# Patient Record
Sex: Female | Born: 1990 | Race: Black or African American | Hispanic: No | Marital: Single | Smoking: Never smoker
Health system: Southern US, Community
[De-identification: ages and names within clinical notes are randomized; demographics above are authoritative.]

## PROBLEM LIST (undated history)

## (undated) DIAGNOSIS — L732 Hidradenitis suppurativa: Secondary | ICD-10-CM

## (undated) DIAGNOSIS — I10 Essential (primary) hypertension: Secondary | ICD-10-CM

## (undated) HISTORY — PX: OTHER SURGICAL HISTORY: SHX169

---
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
## 2012-10-24 DIAGNOSIS — F32A Depression, unspecified: Secondary | ICD-10-CM | POA: Insufficient documentation

## 2013-02-28 IMAGING — CR DG HAND COMPLETE 3+V*L*
3 series · 3 of 3 positions shown · non-contrast
Comparison: None.

CLINICAL DATA: Pain.  No known injury.

LEFT HAND - COMPLETE 3+ VIEW

[view not recorded (1 of 3)]
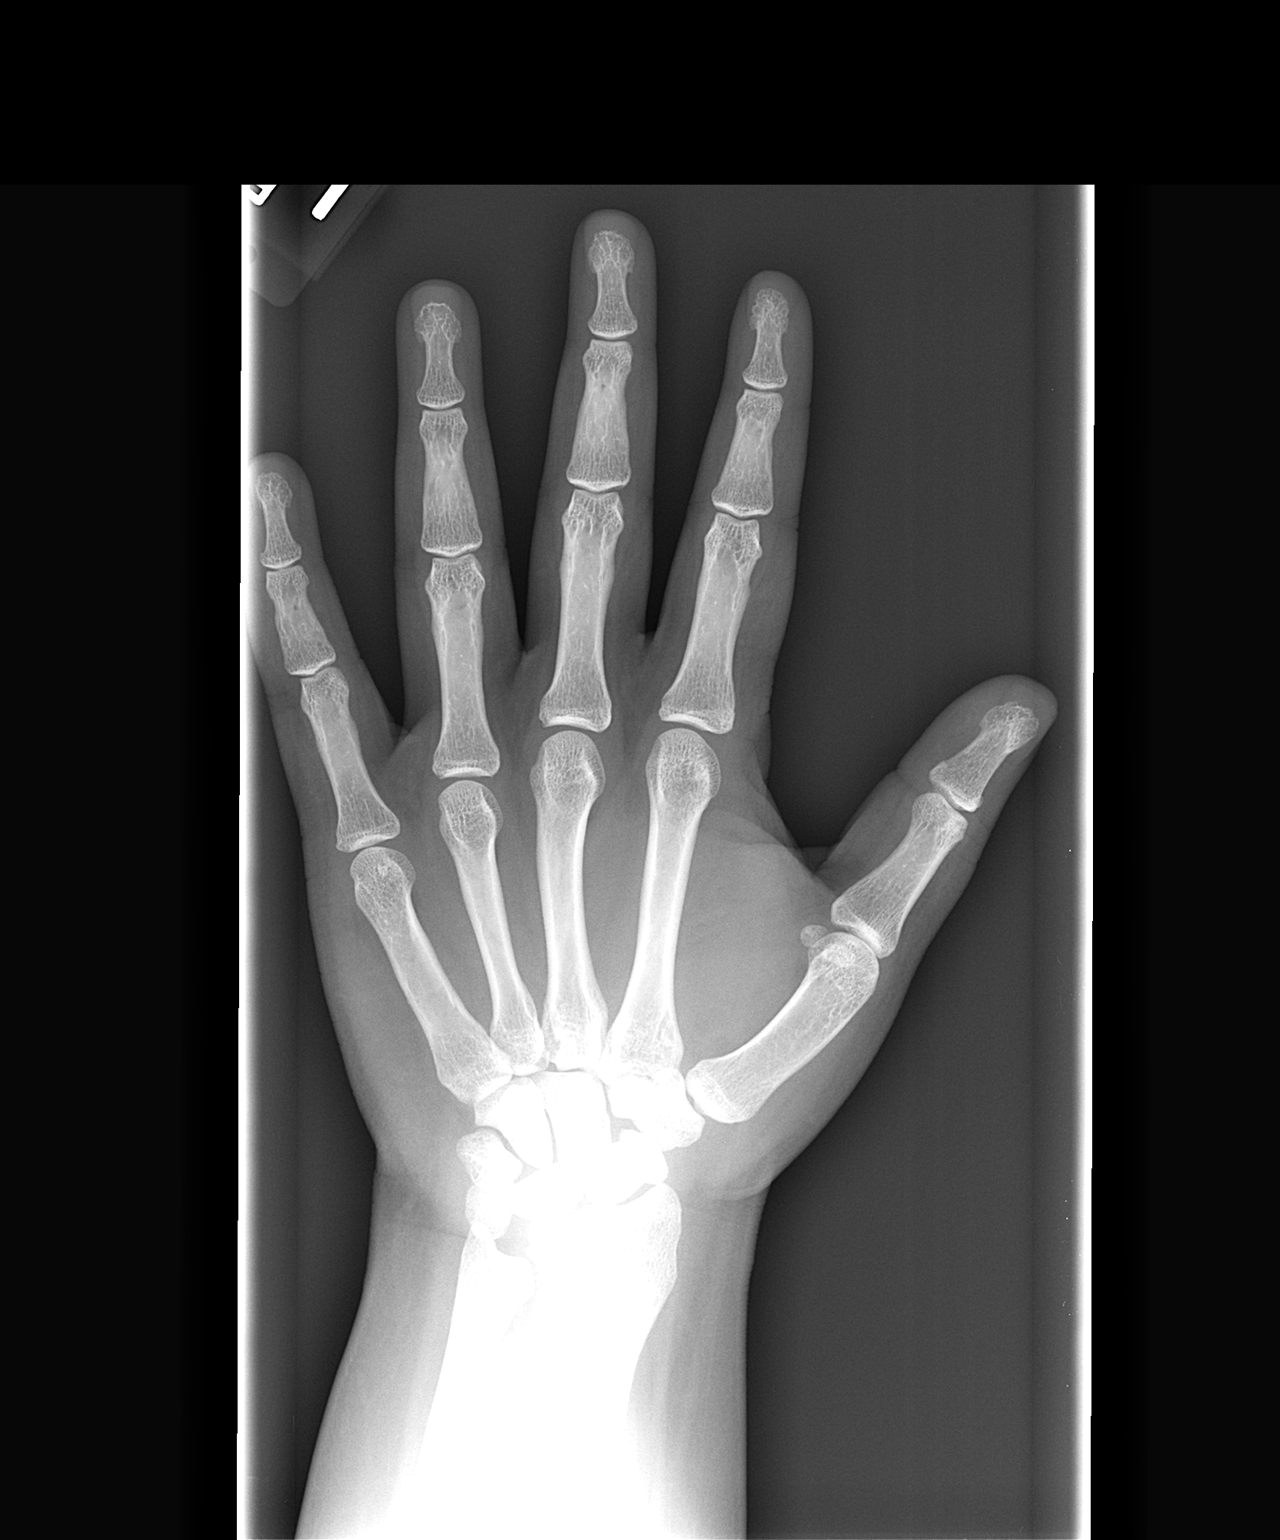

[view not recorded (2 of 3)]
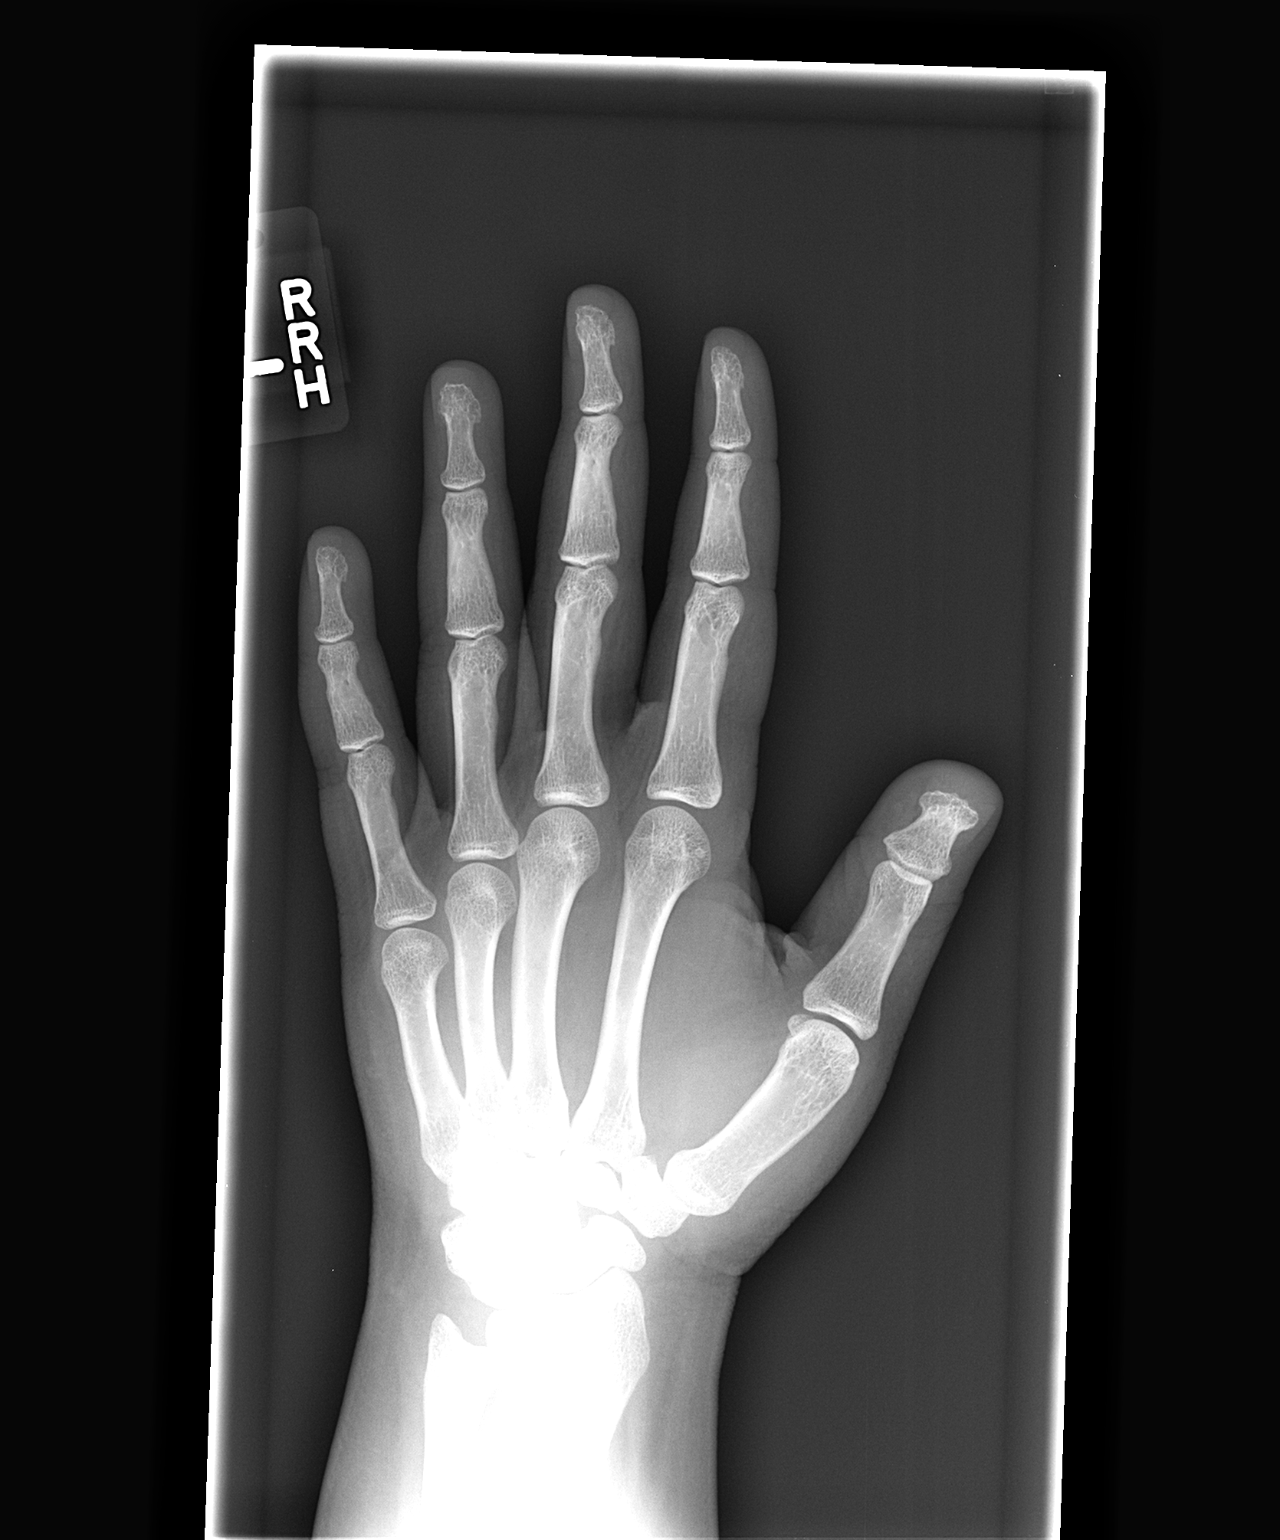

[view not recorded (3 of 3)]
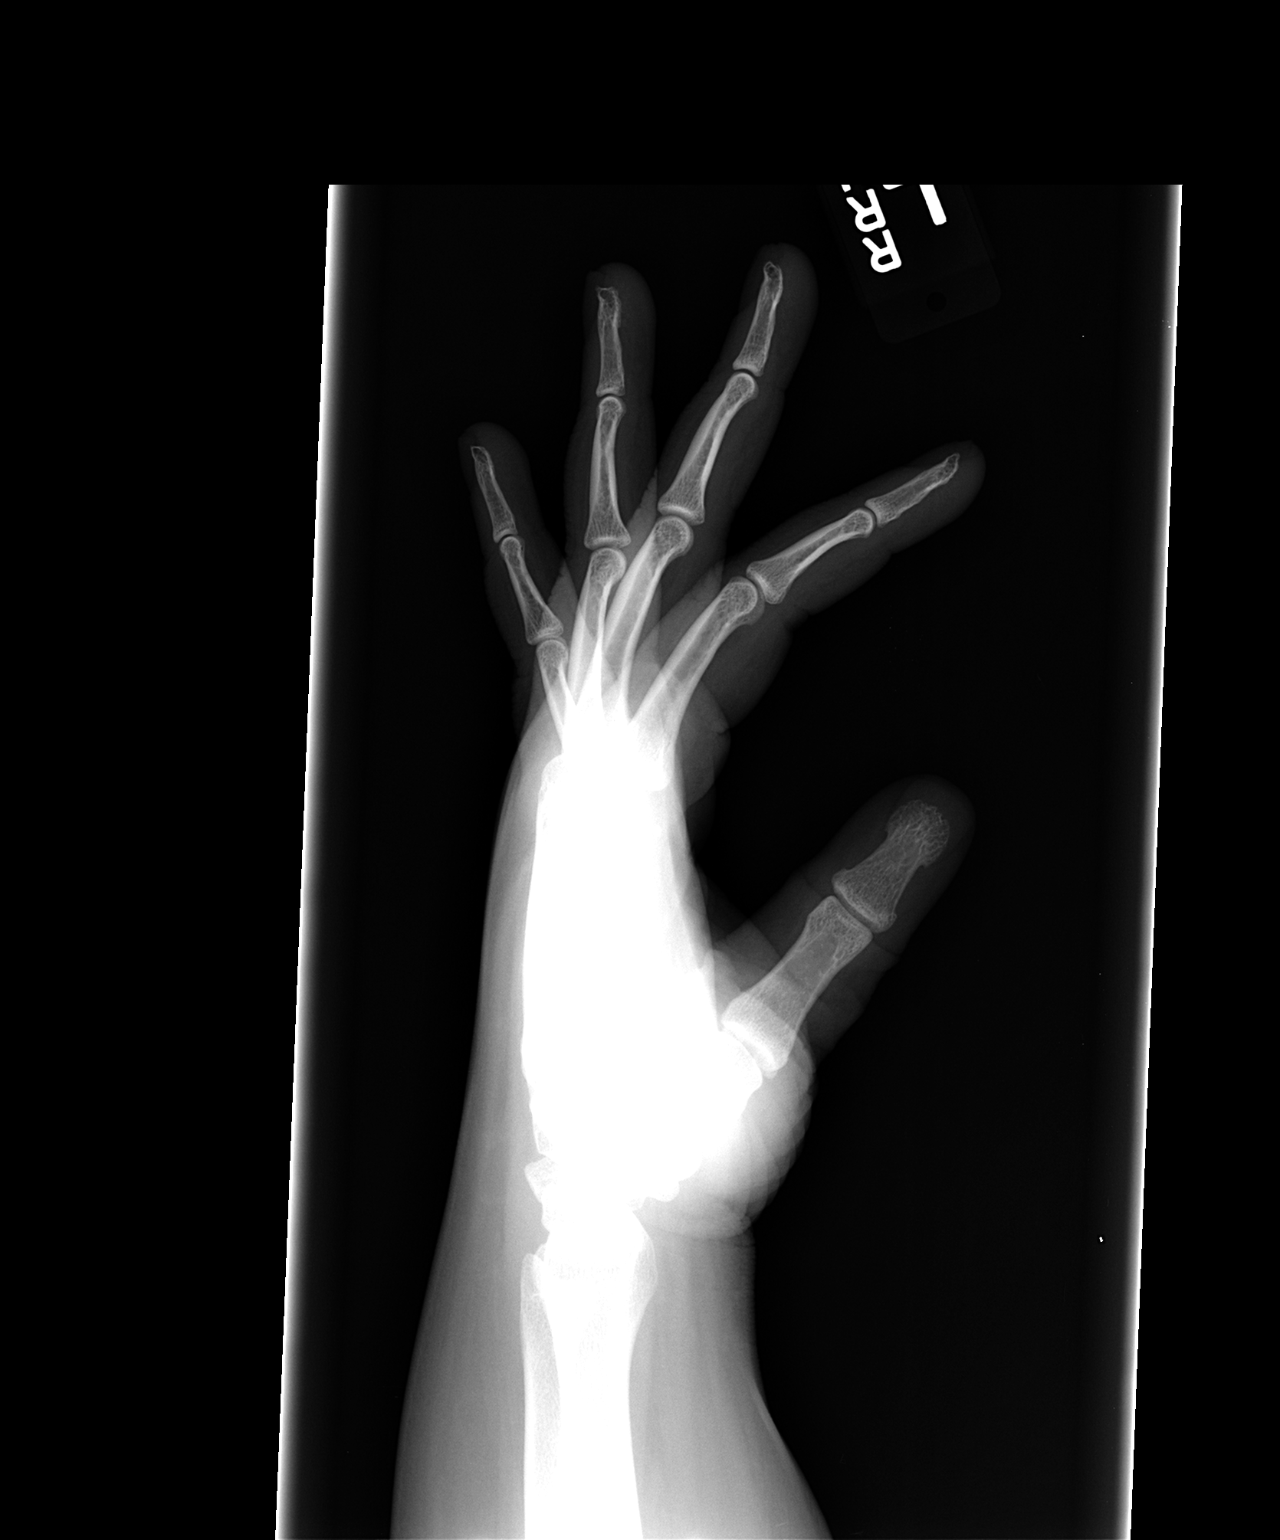

[3 of 3 positions shown; findings below may reference images not displayed]

FINDINGS: There is no evidence of fracture or dislocation.  There
is no evidence of arthropathy or other focal bone abnormality.
Soft tissues are unremarkable.
IMPRESSION: Negative.

## 2013-09-30 DIAGNOSIS — E785 Hyperlipidemia, unspecified: Secondary | ICD-10-CM | POA: Insufficient documentation

## 2021-08-31 ENCOUNTER — Encounter (HOSPITAL_BASED_OUTPATIENT_CLINIC_OR_DEPARTMENT_OTHER): Payer: Self-pay

## 2021-08-31 ENCOUNTER — Emergency Department (HOSPITAL_BASED_OUTPATIENT_CLINIC_OR_DEPARTMENT_OTHER)
Admission: EM | Admit: 2021-08-31 | Discharge: 2021-09-01 | Disposition: A | Discharge status: Home / Self Care | Payer: Self-pay | Attending: Emergency Medicine | Admitting: Emergency Medicine

## 2021-08-31 ENCOUNTER — Other Ambulatory Visit: Payer: Self-pay

## 2021-08-31 DIAGNOSIS — I10 Essential (primary) hypertension: Secondary | ICD-10-CM | POA: Insufficient documentation

## 2021-08-31 DIAGNOSIS — L02214 Cutaneous abscess of groin: Secondary | ICD-10-CM | POA: Insufficient documentation

## 2021-08-31 HISTORY — DX: Essential (primary) hypertension: I10

## 2021-08-31 HISTORY — DX: Hidradenitis suppurativa: L73.2

## 2021-08-31 NOTE — ED Triage Notes (Signed)
Pt c/o left hip pain x 2 days-denies injury-NAD-steady gait

## 2021-09-01 LAB — PREGNANCY, URINE: Preg Test, Ur: NEGATIVE

## 2021-09-01 MED ORDER — DOXYCYCLINE HYCLATE 100 MG PO CAPS: 100.0000 mg | ORAL_CAPSULE | Freq: Two times a day (BID) | ORAL | 0 refills | Status: DC

## 2021-09-01 MED ORDER — LIDOCAINE HCL (PF) 1 % IJ SOLN
5.0000 mL | Freq: Once | INTRAMUSCULAR | Status: AC
  Administered 2021-09-01: 5 mL via INTRADERMAL
  Filled 2021-09-01: qty 5

## 2021-09-01 MED ORDER — HYDROCODONE-ACETAMINOPHEN 5-325 MG PO TABS: 1.0000 | ORAL_TABLET | Freq: Four times a day (QID) | ORAL | 0 refills | Status: DC | PRN

## 2021-09-01 MED ORDER — DOXYCYCLINE HYCLATE 100 MG PO TABS
100.0000 mg | ORAL_TABLET | Freq: Once | ORAL | Status: AC
  Administered 2021-09-01: 100 mg via ORAL
  Filled 2021-09-01: qty 1

## 2021-09-01 NOTE — Discharge Instructions (Addendum)
Begin taking doxycycline as prescribed.  Hydrocodone as prescribed as needed for pain.  Apply warm compresses or perform sits baths as frequently as possible for the next several days.  Return to the emergency department for increased pain, increased swelling, high fever, or other new and concerning symptoms.

## 2021-09-01 NOTE — ED Provider Notes (Signed)
[facility] EMERGENCY DEPARTMENT Provider Note   CSN: [id number] Arrival date & time: 08/31/21  2028     History Chief Complaint  Patient presents with   Hip Pain    Brenda Calhoun is a 30 y.o. female.  Patient is a 29 year old female with history of hypertension and hidradenitis suppurativa.  Patient presenting with left groin pain and swelling.  This has been worsening over the past several days.  She has had multiple abscesses in the past and this feels the same.  She denies any fevers or chills.  Symptoms unrelieved with warm compresses.  The history is provided by the patient.      Past Medical History:  Diagnosis Date   Hydradenitis    Hypertension     There are no problems to display for this patient.   History reviewed. No pertinent surgical history.   OB History   No obstetric history on file.     No family history on file.  Social History   Tobacco Use   Smoking status: Never   Smokeless tobacco: Never  Vaping Use   Vaping Use: Never used  Substance Use Topics   Alcohol use: No   Drug use: No    Home Medications Prior to Admission medications   Not on File    Allergies    Penicillins  Review of Systems   Review of Systems  All other systems reviewed and are negative.  Physical Exam Updated Vital Signs BP (!) 164/94 (BP Location: Right Arm)   Pulse (!) 110   Temp 98.3 F (36.8 C) (Oral)   Resp 18   Ht 5\' 5"  (1.651 m)   Wt (!) 149.7 kg   SpO2 100%   BMI 54.91 kg/m   Physical Exam Vitals and nursing note reviewed.  Constitutional:      Appearance: Normal appearance.  HENT:     Head: Normocephalic and atraumatic.  Pulmonary:     Effort: Pulmonary effort is normal.  Skin:    General: Skin is warm and dry.     Comments: There is a swollen, firm, indurated, fluctuant area to the left inguinal crease.  Neurological:     Mental Status: She is alert.    ED Results / Procedures / Treatments   Labs (all labs ordered  are listed, but only abnormal results are displayed) Labs Reviewed  PREGNANCY, URINE    EKG None  Radiology No results found.  Procedures Procedures   Medications Ordered in ED Medications  lidocaine (PF) (XYLOCAINE) 1 % injection 5 mL (has no administration in time range)    ED Course  I have reviewed the triage vital signs and the nursing notes.  Pertinent labs & imaging results that were available during my care of the patient were reviewed by me and considered in my medical decision making (see chart for details).    MDM Rules/Calculators/A&P  Abscess incised and drained as below.  Patient to be treated with doxycycline, warm compresses and return as needed.  INCISION AND DRAINAGE Performed by: Consent: Verbal consent obtained. Risks and benefits: risks, benefits and alternatives were discussed Type: abscess  Body area: left groin  Anesthesia: local infiltration  Incision was made with a scalpel.  Local anesthetic: lidocaine 1% without epinephrine  Anesthetic total: 3 ml  Complexity: complex Blunt dissection to break up loculations  Drainage: purulent  Drainage amount: copious  Packing material: no packing placed  Patient tolerance: Patient tolerated the procedure well with no immediate  complications.    Final Clinical Impression(s) / ED Diagnoses Final diagnoses:  None    Rx / DC Orders ED Discharge Orders     None        Geoffery Lyons, MD 09/01/21 [phone]

## 2024-08-19 ENCOUNTER — Ambulatory Visit: Payer: Self-pay

## 2024-08-19 VITALS — BP 172/114 | HR 106 | Ht 65.0 in | Wt 340.0 lb

## 2024-08-19 DIAGNOSIS — R0683 Snoring: Secondary | ICD-10-CM

## 2024-08-19 DIAGNOSIS — I10 Essential (primary) hypertension: Secondary | ICD-10-CM

## 2024-08-19 DIAGNOSIS — Z131 Encounter for screening for diabetes mellitus: Secondary | ICD-10-CM

## 2024-08-19 DIAGNOSIS — Z1329 Encounter for screening for other suspected endocrine disorder: Secondary | ICD-10-CM

## 2024-08-19 DIAGNOSIS — Z6841 Body Mass Index (BMI) 40.0 and over, adult: Secondary | ICD-10-CM

## 2024-08-19 MED ORDER — OLMESARTAN MEDOXOMIL-HCTZ 20-12.5 MG PO TABS: 1.0000 | ORAL_TABLET | Freq: Every day | ORAL | 3 refills | Status: AC

## 2024-08-19 NOTE — Progress Notes (Unsigned)
 New Patient Office Visit  Subjective    Patient ID: Brenda Calhoun, female    DOB: 1991/07/21  Age: 33 y.o. MRN: [id number]  CC:  Chief Complaint  Patient presents with   Establish Care   Hypertension    Concerned for high blood pressure    HPI Brenda Calhoun presents to establish care Discussed the use of AI scribe software for clinical note transcription with the patient, who gave verbal consent to proceed.  History of Present Illness   Brenda Calhoun is a 33 year old female with hypertension who presents for establishment of care and management of high blood pressure.  Hypertension - Long-standing elevated blood pressure, described as 'always high' - Previously treated with diuretics ('fluid pills'), but not on a consistent medication regimen recently - Occasionally takes her sister's diuretics when blood pressure feels high, with temporary relief - Consistently elevated blood pressure readings - Family history of hypertension in mother and sister - Desires effective blood pressure management, especially in preparation for planned pregnancy next year  Cardiac symptoms - Occasional heart palpitations described as 'heart flutters' - No chest pain - No shortness of breath  Peripheral edema - Intermittent swelling in feet, attributed to prolonged sitting at work  Sleep disturbance - Poor sleep quality with difficulty falling asleep and non-restorative sleep - Snoring present, concerning to fianc - No episodes of waking up gasping for air  Reproductive health and family planning - On birth control for approximately six years - Considering future family planning and pregnancy       Outpatient Encounter Medications as of 08/19/2024  Medication Sig   olmesartan -hydrochlorothiazide (BENICAR  HCT) 20-12.5 MG tablet Take 1 tablet by mouth daily.   [DISCONTINUED] doxycycline  (VIBRAMYCIN ) 100 MG capsule Take 1 capsule (100 mg total) by mouth 2 (two) times daily. One po bid x 7 days  (Patient not taking: Reported on 08/19/2024)   [DISCONTINUED] doxycycline  (VIBRAMYCIN ) 100 MG capsule Take 1 capsule (100 mg total) by mouth 2 (two) times daily. One po bid x 7 days (Patient not taking: Reported on 08/19/2024)   [DISCONTINUED] HYDROcodone -acetaminophen  (NORCO) 5-325 MG tablet Take 1-2 tablets by mouth every 6 (six) hours as needed. (Patient not taking: Reported on 08/19/2024)   No facility-administered encounter medications on file as of 08/19/2024.    Past Medical History:  Diagnosis Date   Hydradenitis    Hypertension     Past Surgical History:  Procedure Laterality Date   sweat gland Bilateral     History reviewed. No pertinent family history.  Social History   Socioeconomic History   Marital status: Single    Spouse name: Not on file   Number of children: Not on file   Years of education: Not on file   Highest education level: Not on file  Occupational History   Not on file  Tobacco Use   Smoking status: Never   Smokeless tobacco: Never  Vaping Use   Vaping status: Never Used  Substance and Sexual Activity   Alcohol use: No   Drug use: No   Sexual activity: Not on file  Other Topics Concern   Not on file  Social History Narrative   Not on file   Social Drivers of Health   Financial Resource Strain: Not on file  Food Insecurity: No Food Insecurity (03/30/2021)   [facility]   Hunger Vital Sign    Within the past 12 months, you worried that your food would run out before you got the  money to buy more.: Never true    Within the past 12 months, the food you bought just didn't last and you didn't have money to get more.: Never true  Transportation Needs: Not on file  Physical Activity: Not on file  Stress: Not on file  Social Connections: Not on file  Intimate Partner Violence: Not on file    ROS      Objective    BP (!) 172/114   Pulse (!) 106   Ht 5' 5 (1.651 m)   Wt (!) 340 lb (154.2 kg)   SpO2 95%   BMI 56.58 kg/m    Physical Exam Vitals and nursing note reviewed.  Constitutional:      Appearance: Normal appearance. She is obese.  HENT:     Head: Normocephalic.  Eyes:     Extraocular Movements: Extraocular movements intact.     Pupils: Pupils are equal, round, and reactive to light.  Cardiovascular:     Rate and Rhythm: Normal rate and regular rhythm.  Pulmonary:     Effort: Pulmonary effort is normal.     Breath sounds: Normal breath sounds.  Musculoskeletal:     Cervical back: Normal range of motion and neck supple.  Neurological:     Mental Status: She is alert and oriented to person, place, and time.  Psychiatric:        Mood and Affect: Mood normal.        Thought Content: Thought content normal.         Assessment & Plan:   Problem List Items Addressed This Visit       Cardiovascular and Mediastinum   Hypertension - Primary   Chronic hypertension with high readings and edema, likely related to hypertension. Previous HCTZ use provided relief. High blood pressure risks for future pregnancy. - Order labs for thyroid, kidney function, and A1c. - Prescribe combination antihypertensive of olmesartan -HCTZ. - Discussed need for weight reduction with healthy diet and regular exercise. - Educated on blood pressure control importance for future pregnancy.      Relevant Medications   olmesartan -hydrochlorothiazide (BENICAR  HCT) 20-12.5 MG tablet   Other Relevant Orders   Basic Metabolic Panel (BMET) (Completed)     Other   BMI 50.0-59.9, adult (HCC)   Morbid obesity with BMI 50-59.9. Weight loss needed for health improvement and pregnancy preparation. Obesity may affect hypertension and sleep. - Discuss importance of healthy weight before pregnancy.      Relevant Orders   Home sleep test   AMB Referral to Nutrition   Snoring   Snoring and non-restorative sleep suggest possible sleep apnea. - Order sleep study for sleep apnea evaluation.      Relevant Orders   Home sleep  test   Other Visit Diagnoses       Screening for diabetes mellitus       Relevant Orders   HgB A1c (Completed)     Screening for thyroid disorder       Relevant Orders   TSH + free T4 (Completed)           Return in about 4 weeks (around 09/16/2024) for for recheck of symptoms.   Leita Longs, FNP

## 2024-08-21 DIAGNOSIS — F909 Attention-deficit hyperactivity disorder, unspecified type: Secondary | ICD-10-CM | POA: Insufficient documentation

## 2024-08-21 DIAGNOSIS — Z6841 Body Mass Index (BMI) 40.0 and over, adult: Secondary | ICD-10-CM | POA: Insufficient documentation

## 2024-08-21 DIAGNOSIS — R0683 Snoring: Secondary | ICD-10-CM | POA: Insufficient documentation

## 2024-08-21 DIAGNOSIS — I10 Essential (primary) hypertension: Secondary | ICD-10-CM | POA: Insufficient documentation

## 2024-08-21 LAB — BASIC METABOLIC PANEL WITH GFR
BUN/Creatinine Ratio: 8 — ABNORMAL LOW (ref 9–23)
BUN: 6 mg/dL (ref 6–20)
CO2: 22 mmol/L (ref 20–29)
Calcium: 9.2 mg/dL (ref 8.7–10.2)
Chloride: 101 mmol/L (ref 96–106)
Creatinine, Ser: 0.8 mg/dL (ref 0.57–1.00)
Glucose: 86 mg/dL (ref 70–99)
Potassium: 3.7 mmol/L (ref 3.5–5.2)
Sodium: 141 mmol/L (ref 134–144)
eGFR: 101 mL/min/1.73 (ref >59)

## 2024-08-21 LAB — TSH+FREE T4
Free T4: 1.03 ng/dL (ref 0.82–1.77)
TSH: 1.67 u[IU]/mL (ref 0.450–4.500)

## 2024-08-21 LAB — HEMOGLOBIN A1C
Est. average glucose Bld gHb Est-mCnc: 126 mg/dL
Hgb A1c MFr Bld: 6 % — ABNORMAL HIGH (ref 4.8–5.6)

## 2024-08-21 NOTE — Assessment & Plan Note (Signed)
 Snoring and non-restorative sleep suggest possible sleep apnea. - Order sleep study for sleep apnea evaluation.

## 2024-08-21 NOTE — Assessment & Plan Note (Signed)
 Morbid obesity with BMI 50-59.9. Weight loss needed for health improvement and pregnancy preparation. Obesity may affect hypertension and sleep. - Discuss importance of healthy weight before pregnancy.

## 2024-08-21 NOTE — Assessment & Plan Note (Addendum)
 Chronic hypertension with high readings and edema, likely related to hypertension. Previous HCTZ use provided relief. High blood pressure risks for future pregnancy. - Order labs for thyroid, kidney function, and A1c. - Prescribe combination antihypertensive of olmesartan -HCTZ. - Discussed need for weight reduction with healthy diet and regular exercise. - Educated on blood pressure control importance for future pregnancy.

## 2024-08-28 ENCOUNTER — Encounter: Payer: Self-pay | Admitting: Obstetrics & Gynecology

## 2024-08-28 ENCOUNTER — Other Ambulatory Visit (HOSPITAL_COMMUNITY)
Admission: RE | Admit: 2024-08-28 | Discharge: 2024-08-28 | Disposition: A | Source: Ambulatory Visit | Attending: Obstetrics & Gynecology | Admitting: Obstetrics & Gynecology

## 2024-08-28 ENCOUNTER — Ambulatory Visit (INDEPENDENT_AMBULATORY_CARE_PROVIDER_SITE_OTHER): Payer: Self-pay | Admitting: Obstetrics & Gynecology

## 2024-08-28 VITALS — BP 164/107 | HR 90 | Ht 65.0 in | Wt 340.2 lb

## 2024-08-28 DIAGNOSIS — Z01419 Encounter for gynecological examination (general) (routine) without abnormal findings: Secondary | ICD-10-CM

## 2024-08-28 DIAGNOSIS — Z1151 Encounter for screening for human papillomavirus (HPV): Secondary | ICD-10-CM

## 2024-08-28 DIAGNOSIS — Z113 Encounter for screening for infections with a predominantly sexual mode of transmission: Secondary | ICD-10-CM | POA: Insufficient documentation

## 2024-08-28 DIAGNOSIS — I10 Essential (primary) hypertension: Secondary | ICD-10-CM

## 2024-08-28 DIAGNOSIS — Z789 Other specified health status: Secondary | ICD-10-CM

## 2024-08-28 DIAGNOSIS — Z3009 Encounter for other general counseling and advice on contraception: Secondary | ICD-10-CM

## 2024-08-28 DIAGNOSIS — Z975 Presence of (intrauterine) contraceptive device: Secondary | ICD-10-CM

## 2024-08-28 NOTE — Progress Notes (Signed)
 WELL-WOMAN EXAMINATION Patient name: Brenda Calhoun MRN [id number]  Date of birth: 1991-07-14 Chief Complaint:   Gynecologic Exam  History of Present Illness:   Brenda Calhoun is a 33 y.o. G0P0000  female being seen today for a routine well-woman exam and the following concenrs:  - Recently engaged in considering a future pregnancy possibly next year.  She is interested to know how her weight and elevated blood pressure might influence a future pregnancy  Nexplanon since 2016 (2nd device) and has been working well for her.  Menses are irregular both in frequency and length of period.  Denies irregular discharge, itching or irritation.  Denies pelvic or abdominal pain.  She reports no other acute GYN concerns    No LMP recorded. Patient has had an implant.  The current method of family planning is Nexplanon.    Last pap collected today.  Last mammogram: NA. Last colonoscopy: NA     08/28/2024    9:24 AM 08/19/2024    3:25 PM  Depression screen PHQ 2/9  Decreased Interest 1 1  Down, Depressed, Hopeless 1 1  PHQ - 2 Score 2 2  Altered sleeping 2 1  Tired, decreased energy 2 1  Change in appetite 2 2  Feeling bad or failure about yourself  1 1  Trouble concentrating 1 1  Moving slowly or fidgety/restless 1 1  Suicidal thoughts 0 0  PHQ-9 Score 11 9  Difficult doing work/chores  Somewhat difficult      Review of Systems:   Pertinent items are noted in HPI Denies any headaches, blurred vision, fatigue, shortness of breath, chest pain, abdominal pain, bowel movements, urination, or intercourse unless otherwise stated above.  Pertinent History Reviewed:  Reviewed past medical,surgical, social and family history.  Reviewed problem list, medications and allergies. Physical Assessment:   Vitals:   08/28/24 0925 08/28/24 0935  BP: (!) 170/95 (!) 164/107  Pulse: 88 90  Weight: (!) 340 lb 3.2 oz (154.3 kg)   Height: 5' 5 (1.651 m)   Body mass index is 56.61 kg/m.         Physical Examination:   General appearance - well appearing, and in no distress  Mental status - alert, oriented to person, place, and time  Psych:  She has a normal mood and affect  Skin - warm and dry, normal color, no suspicious lesions noted  Chest - effort normal, all lung fields clear to auscultation bilaterally  Heart - normal rate and regular rhythm  Neck:  midline trachea, no thyromegaly or nodules  Breasts - breasts appear normal, no suspicious masses, no skin or nipple changes or  axillary nodes  Abdomen - obese, soft, nontender, nondistended, no masses or organomegaly  Pelvic - VULVA: normal appearing vulva with no masses, tenderness or lesions  VAGINA: normal appearing vagina with normal color and discharge, no lesions  CERVIX: normal appearing cervix without discharge or lesions, no CMT currently on menses  Thin prep pap is done with HR HPV cotesting  UTERUS: uterus is felt to be normal size, shape, consistency and nontender   ADNEXA: No adnexal masses or tenderness noted-exam limited due to body habitus  Extremities:  No swelling or varicosities noted  Chaperone: Alan Fischer     Assessment & Plan:  1) Well-Woman Exam -pap collected, reviewed ASCCP guidelines - STI screening to be completed  2) Family planning/weight management - Currently Nexplanon in place, discussed until device removed will not get a clear picture of her ovulation status -  Discussed weight and is a factor in future fertility and PCOS.  Discussed definition of PCOS and reviewed her questions and concerns - Patient wants to be held accountable for changing some of her food habits and plan to follow-up in 3 months  3) nexplanon in place []  Will also plan for removal of Nexplanon at that time  4) Chronic HTN - Patient did not take her medication this a.m., and discussed proper use of medication regularly    Orders Placed This Encounter  Procedures   RPR   HIV Antibody (routine testing w rflx)     Meds: No orders of the defined types were placed in this encounter.   Follow-up: Return in about 3 months (around 11/27/2024) for Nexplanon removal/f/u with Richad Ramsay.   Larnce Schnackenberg, DO Attending Obstetrician & Gynecologist, [facility] for [employer], [facility]

## 2024-08-29 ENCOUNTER — Ambulatory Visit: Payer: Self-pay | Admitting: Obstetrics & Gynecology

## 2024-08-29 LAB — RPR: RPR Ser Ql: NONREACTIVE

## 2024-08-29 LAB — HIV ANTIBODY (ROUTINE TESTING W REFLEX): HIV Screen 4th Generation wRfx: NONREACTIVE

## 2024-08-30 LAB — CYTOLOGY - PAP
Chlamydia: NEGATIVE
Comment: NEGATIVE
Comment: NEGATIVE
Comment: NEGATIVE
Comment: NORMAL
Diagnosis: NEGATIVE
High risk HPV: NEGATIVE
Neisseria Gonorrhea: NEGATIVE
Trichomonas: NEGATIVE

## 2024-09-16 ENCOUNTER — Ambulatory Visit

## 2024-10-07 ENCOUNTER — Ambulatory Visit: Payer: Self-pay

## 2024-10-11 ENCOUNTER — Ambulatory Visit

## 2024-11-21 ENCOUNTER — Emergency Department (HOSPITAL_COMMUNITY)
Admission: EM | Admit: 2024-11-21 | Discharge: 2024-11-21 | Disposition: A | Discharge status: Home / Self Care | Payer: Self-pay | Attending: Emergency Medicine | Admitting: Emergency Medicine

## 2024-11-21 ENCOUNTER — Encounter (HOSPITAL_COMMUNITY): Payer: Self-pay | Admitting: *Deleted

## 2024-11-21 ENCOUNTER — Emergency Department (HOSPITAL_COMMUNITY): Payer: Self-pay

## 2024-11-21 DIAGNOSIS — D649 Anemia, unspecified: Secondary | ICD-10-CM | POA: Insufficient documentation

## 2024-11-21 DIAGNOSIS — E876 Hypokalemia: Secondary | ICD-10-CM | POA: Insufficient documentation

## 2024-11-21 DIAGNOSIS — I10 Essential (primary) hypertension: Secondary | ICD-10-CM | POA: Insufficient documentation

## 2024-11-21 DIAGNOSIS — J210 Acute bronchiolitis due to respiratory syncytial virus: Secondary | ICD-10-CM | POA: Insufficient documentation

## 2024-11-21 DIAGNOSIS — Z79899 Other long term (current) drug therapy: Secondary | ICD-10-CM | POA: Insufficient documentation

## 2024-11-21 DIAGNOSIS — J181 Lobar pneumonia, unspecified organism: Secondary | ICD-10-CM | POA: Insufficient documentation

## 2024-11-21 DIAGNOSIS — J189 Pneumonia, unspecified organism: Secondary | ICD-10-CM

## 2024-11-21 LAB — BASIC METABOLIC PANEL WITH GFR
Anion gap: 10 (ref 5–15)
BUN: 8 mg/dL (ref 6–20)
CO2: 29 mmol/L (ref 22–32)
Calcium: 9.1 mg/dL (ref 8.9–10.3)
Chloride: 96 mmol/L — ABNORMAL LOW (ref 98–111)
Creatinine, Ser: 0.79 mg/dL (ref 0.44–1.00)
GFR, Estimated: >60 mL/min (ref >60)
Glucose, Bld: 117 mg/dL — ABNORMAL HIGH (ref 70–99)
Potassium: 3.3 mmol/L — ABNORMAL LOW (ref 3.5–5.1)
Sodium: 135 mmol/L (ref 135–145)

## 2024-11-21 LAB — CBC WITH DIFFERENTIAL/PLATELET
Abs Immature Granulocytes: 0.04 K/uL (ref 0.00–0.07)
Basophils Absolute: 0 K/uL (ref 0.0–0.1)
Basophils Relative: 0 %
Eosinophils Absolute: 0.2 K/uL (ref 0.0–0.5)
Eosinophils Relative: 2 %
HCT: 35.1 % — ABNORMAL LOW (ref 36.0–46.0)
Hemoglobin: 10.8 g/dL — ABNORMAL LOW (ref 12.0–15.0)
Immature Granulocytes: 0 %
Lymphocytes Relative: 16 %
Lymphs Abs: 1.5 K/uL (ref 0.7–4.0)
MCH: 24.4 pg — ABNORMAL LOW (ref 26.0–34.0)
MCHC: 30.8 g/dL (ref 30.0–36.0)
MCV: 79.2 fL — ABNORMAL LOW (ref 80.0–100.0)
Monocytes Absolute: 0.8 K/uL (ref 0.1–1.0)
Monocytes Relative: 9 %
Neutro Abs: 6.8 K/uL (ref 1.7–7.7)
Neutrophils Relative %: 73 %
Platelets: 256 K/uL (ref 150–400)
RBC: 4.43 MIL/uL (ref 3.87–5.11)
RDW: 15.2 % (ref 11.5–15.5)
WBC: 9.4 K/uL (ref 4.0–10.5)
nRBC: 0 % (ref 0.0–0.2)

## 2024-11-21 LAB — RESP PANEL BY RT-PCR (RSV, FLU A&B, COVID)  RVPGX2
Influenza A by PCR: NEGATIVE
Influenza B by PCR: NEGATIVE
Resp Syncytial Virus by PCR: POSITIVE — AB
SARS Coronavirus 2 by RT PCR: NEGATIVE

## 2024-11-21 MED ORDER — AZITHROMYCIN 250 MG PO TABS: 250.0000 mg | ORAL_TABLET | Freq: Every day | ORAL | 0 refills | Status: DC

## 2024-11-21 MED ORDER — SODIUM CHLORIDE 0.9 % IV BOLUS
1000.0000 mL | Freq: Once | INTRAVENOUS | Status: AC
  Administered 2024-11-21: 21:00:00 1000 mL via INTRAVENOUS

## 2024-11-21 MED ORDER — ALBUTEROL SULFATE HFA 108 (90 BASE) MCG/ACT IN AERS
2.0000 | INHALATION_SPRAY | Freq: Once | RESPIRATORY_TRACT | Status: AC
  Administered 2024-11-21: 22:00:00 2 via RESPIRATORY_TRACT
  Filled 2024-11-21: qty 6.7

## 2024-11-21 MED ADMIN — Potassium Chloride Microencapsulated Crys ER Tab 20 mEq: 40 meq | ORAL | @ 22:00:00 | NDC 00245531989

## 2024-11-21 MED ADMIN — Azithromycin Tab 250 MG: 500 mg | ORAL | @ 21:00:00 | NDC 69452017113

## 2024-11-21 MED FILL — Potassium Chloride Microencapsulated Crys ER Tab 20 mEq: 40.0000 meq | ORAL | Qty: 2 | Status: AC

## 2024-11-21 MED FILL — Azithromycin Tab 250 MG: 500.0000 mg | ORAL | Qty: 2 | Status: AC

## 2024-11-21 NOTE — ED Triage Notes (Signed)
 Pt with cough, coughing spells that it's hard to catch her breath. Cough is NP, + fevers - 99 per pt yesterday. + sick contacts

## 2024-11-21 NOTE — ED Provider Notes (Signed)
 [place] EMERGENCY DEPARTMENT AT [facility] Provider Note   CSN: [id number] Arrival date & time: 11/21/24  8170     Patient presents with: Cough   Brenda Calhoun is a 33 y.o. female.  She has no significant past medical history other than hypertension.  Complaining of feeling sick for 3 days with cough nonproductive shortness of breath feeling feverish and chilled.  No vomiting or diarrhea.  Has tried nothing for her symptoms.  Multiple sick contacts.  {Add pertinent medical, surgical, social history, OB history to YEP:67052} The history is provided by the patient.  Cough Cough characteristics:  Non-productive Sputum characteristics:  Nondescript Severity:  Moderate Onset quality:  Gradual Duration:  4 days Timing:  Intermittent Progression:  Unchanged Chronicity:  New Smoker: no   Relieved by:  Nothing Associated symptoms: fever, rhinorrhea and shortness of breath        Prior to Admission medications  Medication Sig Start Date End Date Taking? Authorizing Provider  olmesartan -hydrochlorothiazide (BENICAR  HCT) 20-12.5 MG tablet Take 1 tablet by mouth daily. 08/19/24   Bevely Doffing, FNP    Allergies: Penicillins    Review of Systems  Constitutional:  Positive for fever.  HENT:  Positive for rhinorrhea.   Respiratory:  Positive for cough and shortness of breath.     Updated Vital Signs BP (!) 157/91   Pulse (!) 118   Temp 99.5 F (37.5 C) (Temporal)   Resp (!) 21   Ht 5' 5 (1.651 m)   Wt (!) 158.8 kg   SpO2 96%   BMI 58.24 kg/m   Physical Exam Vitals and nursing note reviewed.  Constitutional:      General: She is not in acute distress.    Appearance: Normal appearance. She is well-developed.  HENT:     Head: Normocephalic and atraumatic.  Eyes:     Conjunctiva/sclera: Conjunctivae normal.  Cardiovascular:     Rate and Rhythm: Regular rhythm. Tachycardia present.     Heart sounds: No murmur heard. Pulmonary:     Effort: Pulmonary effort  is normal. No respiratory distress.     Breath sounds: No stridor. Rhonchi (left base) present. No wheezing.  Abdominal:     Palpations: Abdomen is soft.     Tenderness: There is no abdominal tenderness. There is no guarding or rebound.  Musculoskeletal:        General: No deformity.     Cervical back: Neck supple.  Skin:    General: Skin is warm and dry.  Neurological:     General: No focal deficit present.     Mental Status: She is alert.     GCS: GCS eye subscore is 4. GCS verbal subscore is 5. GCS motor subscore is 6.     (all labs ordered are listed, but only abnormal results are displayed) Labs Reviewed  RESP PANEL BY RT-PCR (RSV, FLU A&B, COVID)  RVPGX2 - Abnormal; Notable for the following components:      Result Value   Resp Syncytial Virus by PCR POSITIVE (*)    All other components within normal limits  BASIC METABOLIC PANEL WITH GFR  CBC WITH DIFFERENTIAL/PLATELET  PREGNANCY, URINE    EKG: None  Radiology: DG Chest 2 View Result Date: 11/21/2024 EXAM: 2 VIEW(S) XRAY OF THE CHEST 11/21/2024 06:58:00 PM COMPARISON: None available. CLINICAL HISTORY: cough FINDINGS: LUNGS AND PLEURA: Left basilar consolidation in keeping with acute lobar pneumonia in the appropriate clinical setting. No pleural effusion. No pneumothorax. HEART AND MEDIASTINUM: No acute  abnormality of the cardiac and mediastinal silhouettes. BONES AND SOFT TISSUES: No acute osseous abnormality. IMPRESSION: 1. Left basilar consolidation, consistent with acute lobar pneumonia. Electronically signed by: Dorethia Molt MD 11/21/2024 07:08 PM EST RP Workstation: [id number]    {Document cardiac monitor, telemetry assessment procedure when appropriate:32947} Procedures   Medications Ordered in the ED  sodium chloride  0.9 % bolus 1,000 mL (has no administration in time range)  azithromycin  (ZITHROMAX ) tablet 500 mg (has no administration in time range)    Clinical Course as of 11/21/24 2050  Thu Nov 21, 2024  2039 Asked x-ray interpreted by me as left lower lobe infiltrate.  Awaiting radiology reading. [MB]    Clinical Course User Index [MB] Towana Ozell BROCKS, MD   {Click here for ABCD2, HEART and other calculators REFRESH Note before signing:1}                              Medical Decision Making Amount and/or Complexity of Data Reviewed Labs: ordered. Radiology: ordered.  Risk Prescription drug management.   This patient complains of ***; this involves an extensive number of treatment Options and is a complaint that carries with it a high risk of complications and morbidity. The differential includes ***  I ordered, reviewed and interpreted labs, which included *** I ordered medication *** and reviewed PMP when indicated. I ordered imaging studies which included *** and I independently    visualized and interpreted imaging which showed *** Additional history obtained from *** Previous records obtained and reviewed *** I consulted *** and discussed lab and imaging findings and discussed disposition.  Cardiac monitoring reviewed, *** Social determinants considered, *** Critical Interventions: ***  After the interventions stated above, I reevaluated the patient and found *** Admission and further testing considered, ***   {Document critical care time when appropriate  Document review of labs and clinical decision tools ie CHADS2VASC2, etc  Document your independent review of radiology images and any outside records  Document your discussion with family members, caretakers and with consultants  Document social determinants of health affecting pt's care  Document your decision making why or why not admission, treatments were needed:32947:::1}   Final diagnoses:  None    ED Discharge Orders     None

## 2024-11-21 NOTE — Discharge Instructions (Addendum)
 Please rest and drink plenty of fluids.  Albuterol  inhaler 2 puffs every 4 hours as needed.  Finish antibiotics.  Follow-up with your regular doctor.  Return if any worsening or concerning symptoms.

## 2024-11-25 ENCOUNTER — Ambulatory Visit: Payer: Self-pay

## 2024-11-25 NOTE — Telephone Encounter (Signed)
 Noted

## 2024-11-25 NOTE — Telephone Encounter (Signed)
 FYI Only or Action Required?: FYI only for provider: appointment scheduled on 11/26/24.  Patient was last seen in primary care on 08/19/2024 by Bevely Doffing, FNP.  Called Nurse Triage reporting Cough and Nasal Congestion.  Symptoms began a week ago.  Interventions attempted: Prescription medications: Azithromycin .  Symptoms are: unchanged.  Triage Disposition: See Physician Within 24 Hours  Patient/caregiver understands and will follow disposition?: Yes  Reason for Disposition  [1] Taking antibiotic > 48 hours (2 days) for pneumonia AND [2] fever persists or recurs  Answer Assessment - Initial Assessment Questions Patient states that she started to experience cough, congestion, and fever last "Sunday 12/14 and went to the ED on 11/21/24 for these symptoms. She was diagnosed with pneumonia and prescribed a 4 day course of antibiotics. She has completed this course and states that her symptoms remain the same. She is also experiencing mild shortness of breath, dizziness, and lightheadedness with cough. Office visit scheduled. Advised to go to UC if symptoms worsen.   1. SYMPTOM: What's the main symptom you're concerned about? (e.g., breathing difficulty, fever, weakness)     Difficulty breathing  2. ONSET: When did the  symptoms  start?     Last Sunday 12/14  3. BETTER-SAME-WORSE: Are you getting better, staying the same, or getting worse compared to the day you were last seen?     Same  4. BREATHING DIFFICULTY: Are you having any difficulty breathing? If Yes, ask: How bad is it?  (e.g., none, mild, moderate, severe)      Mild  5. FEVER: Do you have a fever? If Yes, ask: What is your temperature, how was it measured, and when did it start?     Yes, unsure of reading  6. SPUTUM: Describe the color of your sputum (clear, white, yellow, green, blood-tinged)     Clear  7. DIAGNOSIS CONFIRMATION: When was the pneumonia diagnosed? By whom?     12" /18/25 Ozell Arts, ED physician  8. ANTIBIOTIC: Are you taking an antibiotic?  If Yes, ask: Which one? When was it started?     Yes, Azithromycin   9. OTHER TREATMENT: Are you receiving any other treatment for the pneumonia? (e.g., albuterol  nebulizer, oxygen) If Yes, ask: How often? and Does it help?     Inhaler, OPEP device  10. HOSPITAL ADMISSION: Were you hospitalized for this pneumonia? If Yes, ask: When were you discharged home from the hospital?       No  11. O2 SATURATION MONITOR:  Do you use an oxygen saturation monitor (pulse oximeter) at home? If Yes, What is your reading (oxygen level) today? What is your usual oxygen saturation reading? (e.g., 95%)       No  Protocols used: Pneumonia Follow-up Call-A-AH  Copied from CRM #[id number]. Topic: Clinical - Red Word Triage >> Nov 25, 2024  3:23 PM Victoria B wrote: [facility] that prompted transfer to Nurse Triage: patient has sob, cough and congestion , still after ED visit

## 2024-11-26 ENCOUNTER — Ambulatory Visit (INDEPENDENT_AMBULATORY_CARE_PROVIDER_SITE_OTHER): Payer: Self-pay | Admitting: Physician Assistant

## 2024-11-26 ENCOUNTER — Encounter: Payer: Self-pay | Admitting: Physician Assistant

## 2024-11-26 VITALS — BP 144/90 | HR 98 | Temp 97.2°F | Ht 65.0 in | Wt 334.0 lb

## 2024-11-26 DIAGNOSIS — J205 Acute bronchitis due to respiratory syncytial virus: Secondary | ICD-10-CM

## 2024-11-26 DIAGNOSIS — J189 Pneumonia, unspecified organism: Secondary | ICD-10-CM

## 2024-11-26 DIAGNOSIS — I10 Essential (primary) hypertension: Secondary | ICD-10-CM

## 2024-11-26 MED ORDER — PROMETHAZINE-DM 6.25-15 MG/5ML PO SYRP: 5.0000 mL | ORAL_SOLUTION | Freq: Four times a day (QID) | ORAL | 0 refills | Status: DC | PRN

## 2024-11-26 MED ORDER — CEFDINIR 300 MG PO CAPS: 300.0000 mg | ORAL_CAPSULE | Freq: Two times a day (BID) | ORAL | 0 refills | Status: AC

## 2024-11-26 NOTE — Assessment & Plan Note (Signed)
 Persistent symptoms with rhonchi on lung exam. Initial azithromycin  treatment insufficient. Omnicef  chosen due to penicillin allergy. - Prescribed Omnicef  (cefdinir ) twice daily for 10 days. - Continue albuterol  inhaler as needed. - Advised supportive care with ibuprofen  and Mucinex. - Encouraged rest and hydration. - Instructed to monitor for allergic reactions to Omnicef . - Follow up with PCP in 3-4 weeks for repeat chest x-ray or if symptoms persist.

## 2024-11-26 NOTE — Progress Notes (Signed)
 "  Acute Office Visit  Subjective:     Patient ID: Brenda Calhoun, female    DOB: 11-27-1991, 33 y.o.   MRN: [id number]   Discussed the use of AI scribe software for clinical note transcription with the patient, who gave verbal consent to proceed.  History of Present Illness Brenda Calhoun is a 34 year old female who presents with persistent respiratory symptoms following a diagnosis of pneumonia and RSV.  She has had ongoing shortness of breath, cough, lightheadedness, and dizziness since being diagnosed with pneumonia and RSV in the ER on November 21, 2024. She received azithromycin  there and completed a 4-day course at home, with minimal improvement.  She reports intermittent fevers, including this morning. She uses ibuprofen  and Theraflu and uses an albuterol  inhaler for episodes of difficulty breathing, which provides relief when she feels she cannot catch her breath.  She is allergic to penicillin, which causes itchiness. She has not taken Omnicef  or Keflex before. She has not taken any additional antibiotics since finishing azithromycin  yesterday.  Her blood pressure was elevated at the hospital. She takes a blood pressure and fluid pill but forgot her dose this morning.   Review of Systems  Constitutional:  Positive for activity change, fatigue and fever.  HENT:  Positive for congestion. Negative for voice change.   Eyes:  Negative for visual disturbance.  Respiratory:  Positive for cough and shortness of breath.   Cardiovascular:  Negative for chest pain.  Neurological:  Negative for light-headedness and headaches.         Objective:     BP (!) 144/90   Pulse 98   Temp (!) 97.2 F (36.2 C)   Ht 5' 5 (1.651 m)   Wt (!) 334 lb (151.5 kg)   SpO2 97%   BMI 55.58 kg/m   Physical Exam Vitals reviewed.  Constitutional:      General: She is not in acute distress.    Appearance: Normal appearance. She is ill-appearing.  HENT:     Head: Normocephalic and atraumatic.      Nose: Nose normal.     Mouth/Throat:     Mouth: Mucous membranes are moist.     Pharynx: Oropharynx is clear. No posterior oropharyngeal erythema.  Eyes:     Extraocular Movements: Extraocular movements intact.     Conjunctiva/sclera: Conjunctivae normal.  Cardiovascular:     Rate and Rhythm: Normal rate and regular rhythm.     Heart sounds: Normal heart sounds. No murmur heard. Pulmonary:     Effort: Pulmonary effort is normal.     Breath sounds: Rhonchi present. No wheezing or rales.  Lymphadenopathy:     Cervical: No cervical adenopathy.  Skin:    General: Skin is warm and dry.  Neurological:     General: No focal deficit present.     Mental Status: She is alert and oriented to person, place, and time.  Psychiatric:        Mood and Affect: Mood normal.        Behavior: Behavior normal.    No results found for any visits on 11/26/24.      Assessment & Plan:  Community acquired pneumonia of left lower lobe of lung Assessment & Plan: Persistent symptoms with rhonchi on lung exam. Initial azithromycin  treatment insufficient. Omnicef  chosen due to penicillin allergy. - Prescribed Omnicef  (cefdinir ) twice daily for 10 days. - Continue albuterol  inhaler as needed. - Advised supportive care with ibuprofen  and Mucinex. - Encouraged rest and hydration. -  Instructed to monitor for allergic reactions to Omnicef . - Follow up with PCP in 3-4 weeks for repeat chest x-ray or if symptoms persist.   Orders: -     Cefdinir ; Take 1 capsule (300 mg total) by mouth 2 (two) times daily for 10 days.  Dispense: 20 capsule; Refill: 0 -     Promethazine -DM; Take 5 mLs by mouth 4 (four) times daily as needed.  Dispense: 118 mL; Refill: 0  Respiratory syncytial virus (RSV) as cause of acute bronchitis Assessment & Plan: RSV infection with persistent cough and shortness of breath. No specific antiviral treatment available. - Continue supportive care with over-the-counter medications and rest. -  Advised that cough may persist for weeks. - Monitor for worsening symptoms such as increased shortness of breath, chest pain, difficulty breathing.    Primary hypertension Assessment & Plan: Blood pressure slightly elevated, likely due to illness. Missed antihypertensive dose. - Continue follow-up with PCP for blood pressure monitoring. - Advised to resume antihypertensive medication as prescribed.    No follow-ups on file.  Emma Schupp, PA-C  "

## 2024-11-26 NOTE — Assessment & Plan Note (Signed)
 RSV infection with persistent cough and shortness of breath. No specific antiviral treatment available. - Continue supportive care with over-the-counter medications and rest. - Advised that cough may persist for weeks. - Monitor for worsening symptoms such as increased shortness of breath, chest pain, difficulty breathing.

## 2024-11-26 NOTE — Assessment & Plan Note (Signed)
 Blood pressure slightly elevated, likely due to illness. Missed antihypertensive dose. - Continue follow-up with PCP for blood pressure monitoring. - Advised to resume antihypertensive medication as prescribed.

## 2024-12-03 ENCOUNTER — Ambulatory Visit (INDEPENDENT_AMBULATORY_CARE_PROVIDER_SITE_OTHER): Payer: Self-pay

## 2024-12-03 VITALS — BP 146/85 | HR 98 | Ht 65.0 in | Wt 339.1 lb

## 2024-12-03 DIAGNOSIS — R0683 Snoring: Secondary | ICD-10-CM

## 2024-12-03 DIAGNOSIS — I10 Essential (primary) hypertension: Secondary | ICD-10-CM

## 2024-12-03 DIAGNOSIS — Z6841 Body Mass Index (BMI) 40.0 and over, adult: Secondary | ICD-10-CM

## 2024-12-03 DIAGNOSIS — R7303 Prediabetes: Secondary | ICD-10-CM

## 2024-12-03 DIAGNOSIS — E66813 Obesity, class 3: Secondary | ICD-10-CM

## 2024-12-03 NOTE — Progress Notes (Unsigned)
 "  Established Patient Office Visit  Subjective   Patient ID: Brenda Calhoun, female    DOB: [birth year]  Age: 33 y.o. MRN: [id number]  Chief Complaint  Patient presents with   Medical Management of Chronic Issues    4 week follow up, wanted to talk about getting on a GLP-1     HPI Discussed the use of AI scribe software for clinical note transcription with the patient, who gave verbal consent to proceed.  History of Present Illness    Brenda Calhoun is a 33 year old female with hypertension who presents for a follow-up visit.  Hypertension - Elevated blood pressure noted during recent emergency room visit for pneumonia - Consistently taking antihypertensive medication for the past three days, including the morning of this visit - Uncertain reason for persistent elevated blood pressure despite medication adherence  Respiratory symptoms - Recent severe episode of pneumonia, described as the worst illness experienced - Presented to emergency room with extreme fatigue and high blood pressure - Ongoing recovery from upper respiratory infection with persistent nasal congestion and sneezing - No fever during recent illness - Negative COVID-19 and influenza tests - Coughing spell last night - Shortness of breath during pneumonia episode  Weight management concerns - Interest in starting GLP-1 medication for weight management, particularly for abdominal adiposity  Diabetes risk assessment - Family history of diabetes on paternal side - Concern about A1c levels, last checked in September  Sleep apnea risk - No completed sleep study to date - Family history of sleep apnea in sister  Contraceptive management - Scheduled for birth control implant removal in January  Breast cancer risk - Family history of breast cancer in grandmother, diagnosed later in life     Patient Active Problem List   Diagnosis Date Noted   Prediabetes 12/05/2024   Community acquired pneumonia of left lower lobe  of lung 11/26/2024   Respiratory syncytial virus (RSV) as cause of acute bronchitis 11/26/2024   BMI 50.0-59.9, adult (HCC) 08/21/2024   Hypertension 08/21/2024   Snoring 08/21/2024   Attention deficit hyperactivity disorder (ADHD) 08/21/2024   Hyperlipidemia 09/30/2013   Depression 10/24/2012   Hidradenitis axillaris 07/28/2011    ROS    Objective:     BP (!) 146/85   Pulse 98   Ht 5' 5 (1.651 m)   Wt (!) 339 lb 1.3 oz (153.8 kg)   SpO2 96%   BMI 56.43 kg/m  BP Readings from Last 3 Encounters:  12/03/24 (!) 146/85  11/26/24 (!) 144/90  11/21/24 (!) 160/113   Wt Readings from Last 3 Encounters:  12/03/24 (!) 339 lb 1.3 oz (153.8 kg)  11/26/24 (!) 334 lb (151.5 kg)  11/21/24 (!) 350 lb (158.8 kg)     Physical Exam Vitals and nursing note reviewed.  Constitutional:      Appearance: Normal appearance. She is obese.  HENT:     Head: Normocephalic.     Right Ear: Tympanic membrane, ear canal and external ear normal.     Left Ear: Tympanic membrane, ear canal and external ear normal.     Nose: Nose normal.     Mouth/Throat:     Mouth: Mucous membranes are moist.     Pharynx: Oropharynx is clear.  Cardiovascular:     Rate and Rhythm: Normal rate and regular rhythm.  Pulmonary:     Effort: Pulmonary effort is normal.     Breath sounds: Normal breath sounds.  Musculoskeletal:     Cervical back: Normal range  of motion and neck supple.  Skin:    General: Skin is warm and dry.  Neurological:     Mental Status: She is alert and oriented to person, place, and time.  Psychiatric:        Mood and Affect: Mood normal.        Thought Content: Thought content normal.       The ASCVD Risk score (Arnett DK, et al., 2019) failed to calculate for the following reasons:   The 2019 ASCVD risk score is only valid for ages 22 to 58   * - Cholesterol units were assumed    Assessment & Plan:   Problem List Items Addressed This Visit       Cardiovascular and Mediastinum    Hypertension   Blood pressure elevated, possibly due to recent pneumonia and inconsistent medication adherence. - Continue current antihypertensive medication regimen.        Other   BMI 50.0-59.9, adult (HCC) - Primary   Interest in GLP-1 agonist therapy for weight management.  - Reassess GLP-1 therapy eligibility after formulary changes in the new year.      Relevant Orders   Home sleep test   Basic Metabolic Panel (BMET) (Completed)   Snoring   Family history of sleep apnea. No sleep study conducted yet. Insurance coverage contingent on diagnosis of severe sleep apnea. - Scheduled sleep study to evaluate for sleep apnea.      Relevant Orders   Home sleep test   Prediabetes   Family history of diabetes on paternal side. A1c needs re-evaluation. - Ordered A1c test to evaluate current glycemic control.      Relevant Orders   HgB A1c (Completed)   Basic Metabolic Panel (BMET) (Completed)   No follow-ups on file.    Leita Longs, FNP  "

## 2024-12-04 LAB — BASIC METABOLIC PANEL WITH GFR
BUN/Creatinine Ratio: 12 (ref 9–23)
BUN: 9 mg/dL (ref 6–20)
CO2: 21 mmol/L (ref 20–29)
Calcium: 9.3 mg/dL (ref 8.7–10.2)
Chloride: 101 mmol/L (ref 96–106)
Creatinine, Ser: 0.74 mg/dL (ref 0.57–1.00)
Glucose: 113 mg/dL — ABNORMAL HIGH (ref 70–99)
Potassium: 4.1 mmol/L (ref 3.5–5.2)
Sodium: 139 mmol/L (ref 134–144)
eGFR: 111 mL/min/1.73

## 2024-12-04 LAB — HEMOGLOBIN A1C
Est. average glucose Bld gHb Est-mCnc: 126 mg/dL
Hgb A1c MFr Bld: 6 % — ABNORMAL HIGH (ref 4.8–5.6)

## 2024-12-05 ENCOUNTER — Ambulatory Visit: Payer: Self-pay

## 2024-12-05 DIAGNOSIS — R7303 Prediabetes: Secondary | ICD-10-CM | POA: Insufficient documentation

## 2024-12-05 NOTE — Assessment & Plan Note (Signed)
 Family history of diabetes on paternal side. A1c needs re-evaluation. - Ordered A1c test to evaluate current glycemic control.

## 2024-12-05 NOTE — Assessment & Plan Note (Signed)
 Family history of sleep apnea. No sleep study conducted yet. Insurance coverage contingent on diagnosis of severe sleep apnea. - Scheduled sleep study to evaluate for sleep apnea.

## 2024-12-05 NOTE — Assessment & Plan Note (Signed)
 Blood pressure elevated, possibly due to recent pneumonia and inconsistent medication adherence. - Continue current antihypertensive medication regimen.

## 2024-12-05 NOTE — Assessment & Plan Note (Signed)
 Interest in GLP-1 agonist therapy for weight management.  - Reassess GLP-1 therapy eligibility after formulary changes in the new year.

## 2024-12-17 ENCOUNTER — Encounter: Payer: Self-pay | Admitting: Obstetrics & Gynecology

## 2025-01-21 ENCOUNTER — Encounter: Payer: Self-pay | Admitting: Obstetrics & Gynecology

## 2025-03-04 ENCOUNTER — Ambulatory Visit: Payer: Self-pay
